# Patient Record
Sex: Male | Born: 1965 | Race: Black or African American | Hispanic: No | Marital: Single | Smoking: Former smoker
Health system: Southern US, Community
[De-identification: ages and names within clinical notes are randomized; demographics above are authoritative.]

## PROBLEM LIST (undated history)

## (undated) DIAGNOSIS — K219 Gastro-esophageal reflux disease without esophagitis: Secondary | ICD-10-CM

## (undated) HISTORY — PX: DENTAL SURGERY: SHX609

## (undated) HISTORY — PX: WISDOM TOOTH EXTRACTION: SHX21

## (undated) HISTORY — DX: Gastro-esophageal reflux disease without esophagitis: K21.9

---

## 1986-09-15 HISTORY — PX: ARM WOUND REPAIR / CLOSURE: SUR1141

## 1986-09-15 HISTORY — PX: FOREIGN BODY REMOVAL ABDOMINAL: SHX5319

## 2012-05-31 ENCOUNTER — Ambulatory Visit (INDEPENDENT_AMBULATORY_CARE_PROVIDER_SITE_OTHER): Payer: BC Managed Care – PPO | Admitting: Family Medicine

## 2012-05-31 VITALS — BP 132/74 | HR 50 | Temp 98.2°F | Resp 18 | Ht 72.0 in | Wt 222.0 lb

## 2012-05-31 DIAGNOSIS — R42 Dizziness and giddiness: Secondary | ICD-10-CM

## 2012-05-31 DIAGNOSIS — R079 Chest pain, unspecified: Secondary | ICD-10-CM

## 2012-05-31 DIAGNOSIS — K219 Gastro-esophageal reflux disease without esophagitis: Secondary | ICD-10-CM

## 2012-05-31 DIAGNOSIS — H8110 Benign paroxysmal vertigo, unspecified ear: Secondary | ICD-10-CM

## 2012-05-31 LAB — POCT CBC
Granulocyte percent: 46.6 %G (ref 37–80)
Lymph, poc: 2.4 (ref 0.6–3.4)
MCHC: 30.9 g/dL — AB (ref 31.8–35.4)
MID (cbc): 0.5 (ref 0–0.9)
MPV: 8.9 fL (ref 0–99.8)
POC Granulocyte: 2.5 (ref 2–6.9)
POC LYMPH PERCENT: 44.3 %L (ref 10–50)
POC MID %: 9.1 %M (ref 0–12)
Platelet Count, POC: 225 10*3/uL (ref 142–424)
RDW, POC: 14.6 %

## 2012-05-31 LAB — COMPREHENSIVE METABOLIC PANEL
AST: 21 U/L (ref 0–37)
Albumin: 5.3 g/dL — ABNORMAL HIGH (ref 3.5–5.2)
Alkaline Phosphatase: 49 U/L (ref 39–117)
Glucose, Bld: 75 mg/dL (ref 70–99)
Potassium: 4 mEq/L (ref 3.5–5.3)
Sodium: 140 mEq/L (ref 135–145)
Total Bilirubin: 1.8 mg/dL — ABNORMAL HIGH (ref 0.3–1.2)
Total Protein: 7.9 g/dL (ref 6.0–8.3)

## 2012-05-31 MED ORDER — OMEPRAZOLE 40 MG PO CPDR: 40.0000 mg | DELAYED_RELEASE_CAPSULE | Freq: Every day | ORAL | Status: DC

## 2012-05-31 NOTE — Patient Instructions (Addendum)
RETURN 7:45-2  ON Friday FOR RECHECK  TAKE ASPIRIN ONE DAILY FOR CIRCULATION  IF WORSE DIZZINESS WE WILL REFER YOU TO AN ENT DOCTOR FOR FURTHER EVALUATION  TAKE OMEPRAZOLE ONE DAILY FOR TO REDUCE STOMACH ACIDS

## 2012-05-31 NOTE — Progress Notes (Signed)
Subjective: Patient is a long-distance Naval architect. He has been having dizziness for a few days. When he stands up quickly or rolls over in bed he gets a roofing business. It does not last for long period he has had some nausea but no vomiting. He also has for some time been having some substernal chest pain. Does not smoke. He does do some exercise. He drinks barely weekends. He tries with the team back and forth to [place] every week. He says that this is positive, lying in a back of the truck sleeping and bouncing a long period  Objective: No acute distress. TMs normal. Eyes PERRLA. Fundi benign. EOMs intact. Throat clear. Neck supple without nodes thyromegaly. No carotid bruits. Chest clear. Heart regular without murmurs gallops or arrhythmias. Romberg negative. Finger to nose normal. Tandem walk normal. Cardiovascular normal. The motor strength is symmetrical.  Assessment: Positional vertigo Chest pains, atypical  Plan: EKG and labs  Results for orders placed in visit on 05/31/12  POCT CBC      Component Value Range   WBC 5.4  4.6 - 10.2 K/uL   Lymph, poc 2.4  0.6 - 3.4   POC LYMPH PERCENT 44.3  10 - 50 %L   MID (cbc) 0.5  0 - 0.9   POC MID % 9.1  0 - 12 %M   POC Granulocyte 2.5  2 - 6.9   Granulocyte percent 46.6  37 - 80 %G   RBC 5.42  4.69 - 6.13 M/uL   Hemoglobin 15.4  14.1 - 18.1 g/dL   HCT, POC 81.1  91.4 - 53.7 %   MCV 91.8  80 - 97 fL   MCH, POC 28.4  27 - 31.2 pg   MCHC 30.9 (*) 31.8 - 35.4 g/dL   RDW, POC 78.2     Platelet Count, POC 225  142 - 424 K/uL   MPV 8.9  0 - 99.8 fL  GLUCOSE, POCT (MANUAL RESULT ENTRY)      Component Value Range   POC Glucose 71  70 - 99 mg/dl   It sounds like he has a mild BPPV I am going to keep him out of driving for about 5 days, and see how he is doing by the end of the week and also going to treat his chest pains with omeprazole.

## 2012-05-31 NOTE — Progress Notes (Signed)
  Subjective:    [name], male    DOB: 06/11/1967, 46 y.o.   MRN: [id number]  HPI    Review of Systems     Objective:   Physical Exam        Assessment & Plan:

## 2012-06-04 ENCOUNTER — Ambulatory Visit (INDEPENDENT_AMBULATORY_CARE_PROVIDER_SITE_OTHER): Payer: BC Managed Care – PPO | Admitting: Family Medicine

## 2012-06-04 VITALS — BP 105/70 | HR 85 | Temp 98.2°F | Resp 16 | Ht 71.5 in | Wt 221.0 lb

## 2012-06-04 DIAGNOSIS — K219 Gastro-esophageal reflux disease without esophagitis: Secondary | ICD-10-CM

## 2012-06-04 DIAGNOSIS — H8110 Benign paroxysmal vertigo, unspecified ear: Secondary | ICD-10-CM
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
## 2012-06-04 NOTE — Patient Instructions (Signed)
Pull off of the road if any dizziness or chest pains, and get help.  Return to regular duty.  If recurrences of dizziness we will send to and ear-nose-throat doctor (otolaryngologist) for further testing.

## 2012-09-15 HISTORY — PX: COLONOSCOPY: SHX174

## 2012-09-15 HISTORY — PX: POLYPECTOMY: SHX149

## 2012-11-29 ENCOUNTER — Ambulatory Visit (INDEPENDENT_AMBULATORY_CARE_PROVIDER_SITE_OTHER): Payer: BC Managed Care – PPO | Admitting: Internal Medicine

## 2012-11-29 VITALS — BP 112/80 | HR 55 | Temp 98.3°F | Resp 14 | Ht 70.5 in | Wt 218.0 lb

## 2012-11-29 DIAGNOSIS — L739 Follicular disorder, unspecified: Secondary | ICD-10-CM

## 2012-11-29 DIAGNOSIS — L738 Other specified follicular disorders: Secondary | ICD-10-CM

## 2012-11-29 MED ORDER — DOXYCYCLINE HYCLATE 100 MG PO TABS: 100.0000 mg | ORAL_TABLET | Freq: Two times a day (BID) | ORAL | Status: DC

## 2012-11-29 NOTE — Patient Instructions (Addendum)
Folliculitis   Folliculitis is redness, soreness, and swelling (inflammation) of the hair follicles. This condition can occur anywhere on the body. People with weakened immune systems, diabetes, or obesity have a greater risk of getting folliculitis.  CAUSES   Bacterial infection. This is the most common cause.   Fungal infection.   Viral infection.   Contact with certain chemicals, especially oils and tars.  Long-term folliculitis can result from bacteria that live in the nostrils. The bacteria may trigger multiple outbreaks of folliculitis over time.  SYMPTOMS  Folliculitis most commonly occurs on the scalp, thighs, legs, back, buttocks, and areas where hair is shaved frequently. An early sign of folliculitis is a small, white or yellow, pus-filled, itchy lesion (pustule). These lesions appear on a red, inflamed follicle. They are usually less than 0.2 inches (5 mm) wide. When there is an infection of the follicle that goes deeper, it becomes a boil or furuncle. A group of closely packed boils creates a larger lesion (carbuncle). Carbuncles tend to occur in hairy, sweaty areas of the body.  DIAGNOSIS   Your caregiver can usually tell what is wrong by doing a physical exam. A sample may be taken from one of the lesions and tested in a lab. This can help determine what is causing your folliculitis.  TREATMENT   Treatment may include:   Applying warm compresses to the affected areas.   Taking antibiotic medicines orally or applying them to the skin.   Draining the lesions if they contain a large amount of pus or fluid.   Laser hair removal for cases of long-lasting folliculitis. This helps to prevent regrowth of the hair.  HOME CARE INSTRUCTIONS   Apply warm compresses to the affected areas as directed by your caregiver.   If antibiotics are prescribed, take them as directed. Finish them even if you start to feel better.   You may take over-the-counter medicines to relieve itching.   Do not shave  irritated skin.   Follow up with your caregiver as directed.  SEEK IMMEDIATE MEDICAL CARE IF:    You have increasing redness, swelling, or pain in the affected area.   You have a fever.  MAKE SURE YOU:   Understand these instructions.   Will watch your condition.   Will get help right away if you are not doing well or get worse.  Document Released: 11/10/2001 Document Revised: 03/02/2012 Document Reviewed: 12/02/2011  [employer] Patient Information 2013 [employer].

## 2012-11-29 NOTE — Progress Notes (Signed)
  Subjective:    [name], male    DOB: 06/11/1967, 47 y.o.   MRN: [id number]  HPI Has about 1 week of rash on arms and trunk. Itchy small papules assoc with hair follicles.   Review of Systems     Objective:   Physical Exam Left arm and left trunk total of about 5-10 papules. Very healthy and fit       Assessment & Plan:  Folliculitis/Doxycycline

## 2013-04-08 ENCOUNTER — Ambulatory Visit (INDEPENDENT_AMBULATORY_CARE_PROVIDER_SITE_OTHER): Payer: BC Managed Care – PPO | Admitting: Internal Medicine

## 2013-04-08 VITALS — BP 110/72 | HR 73 | Temp 98.0°F | Resp 18 | Ht 71.0 in | Wt 212.0 lb

## 2013-04-08 DIAGNOSIS — Z0000 Encounter for general adult medical examination without abnormal findings: Secondary | ICD-10-CM

## 2013-04-08 LAB — POCT CBC
Hemoglobin: 15.2 g/dL (ref 14.1–18.1)
MCH, POC: 29.9 pg (ref 27–31.2)
MCHC: 32.4 g/dL (ref 31.8–35.4)
MID (cbc): 0.4 (ref 0–0.9)
MPV: 9 fL (ref 0–99.8)
POC Granulocyte: 2.1 (ref 2–6.9)
POC MID %: 9.7 %M (ref 0–12)
Platelet Count, POC: 244 10*3/uL (ref 142–424)
RBC: 5.08 M/uL (ref 4.69–6.13)
WBC: 4.1 10*3/uL — AB (ref 4.6–10.2)

## 2013-04-08 LAB — COMPREHENSIVE METABOLIC PANEL
ALT: 24 U/L (ref 0–53)
CO2: 25 mEq/L (ref 19–32)
Creat: 1.19 mg/dL (ref 0.50–1.35)
Total Bilirubin: 1.4 mg/dL — ABNORMAL HIGH (ref 0.3–1.2)

## 2013-04-08 LAB — LIPID PANEL
Cholesterol: 170 mg/dL (ref 0–200)
HDL: 40 mg/dL (ref >39)
Total CHOL/HDL Ratio: 4.3 Ratio

## 2013-04-08 LAB — POCT URINALYSIS DIPSTICK
Blood, UA: NEGATIVE
Nitrite, UA: NEGATIVE
Protein, UA: 100
Spec Grav, UA: 1.025
Urobilinogen, UA: 1

## 2013-04-08 LAB — POCT UA - MICROSCOPIC ONLY
Crystals, Ur, HPF, POC: NEGATIVE
Sperm: POSITIVE
Yeast, UA: NEGATIVE
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
## 2013-04-08 NOTE — Patient Instructions (Signed)
Abdominal Aortic Aneurysm  An aneurysm is the enlargement (dilatation), bulging, or ballooning out of part of the wall of a vein or artery. An aortic aneurysm is a bulging in the largest artery of the body. This artery supplies blood from the heart to the rest of the body.  The first part of the aorta is called the thoracic aorta. It leaves the heart, rises (ascends), arches, and goes down (descends) through the chest until it reaches the diaphragm. The diaphragm is the muscular part between the chest and abdomen.  The second part of the aorta is called the abdominal aorta after it has passed the diaphragm and continues down through the abdomen. The abdominal aorta ends where it splits to form the two iliac arteries that go to the legs. Aortic aneurysms can develop anywhere along the length of the aorta. The majority are located along the abdominal aorta. The major concern with an aortic aneurysm is that it can enlarge and rupture. This can cause death unless diagnosed and treated promptly. Aneurysms can also develop blood clots or infections. CAUSES  Many aortic aneurysms are caused by arteriosclerosis. Arteriosclerosis can weaken the aortic wall. The pressure of the blood being pumped through the aorta causes it to balloon out at the site of weakness. Therefore, high blood pressure (hypertension) is associated with aneurysm. Other risk factors include:  Age over 60.  Tobacco use.  Being male.  White race.  Family history of aneurysm.  Less frequent causes of abdominal aortic aneurysms include:  Connective tissue diseases.  Abdominal trauma.  Inflammation of blood vessles (arteritis).  Inherited (congenital) malformations.  Infection. SYMPTOMS  The signs and symptoms of an unruptured aneurysm will partly depend on its size and rate of growth.   Abdominal aortic aneurysms may cause pain. The pain typically has a deep quality as if it is piercing into the person. It is felt most  often in the lower back area. The pain is usually steady but may be relieved by changing your body position.  The person may also become aware of an abnormally prominent pulse in the belly (abdominal pulsation). DIAGNOSIS  An aortic aneurysm may be discovered by chance on physical exam, or on X-ray studies done for other reasons. It may be suspected because of other problems such as back or abdominal pain. The following tests may help identify the problem.  X-rays of the abdomen can show calcium deposits in the aneurysm wall.  CT scanning of the abdomen, particularly with contrast medium, is accurate at showing the exact size and shape of the aneurysm.  Ultrasounds give a clear picture of the size of an aneurysm (about 98% accuracy).  MRI scanning is accurate, but often unnecessary.  An abdominal angiogram shows the source of the major blood vessels arising from the aorta. It reveals the size and extent of any aneurysm. It can also show a clot clinging to the wall of the aneurysm (mural thrombus). TREATMENT  Treating an abdominal aortic aneurysm depends on the size. A rupture of an aneurysm is uncommon when they are less than 5 cm wide (2 inches). Rupture is far more common in aneurysms that are over 6 cm wide (2.4 inches).  Surgical repair is usually recommended for all aneurysms over 6 cm wide (2.4 inches). This depends on the health, age, and other circumstances of the individual. This type of surgery consists of opening the abdomen, removing the aneurysm, and sewing a synthetic graft (similar to a cloth tube) in its place. A   less invasive form of this surgery, using stent grafts, is sometimes recommended.  For most patients, elective repair is recommended for aneurysms between 4 and 6 cm (1.6 and 2.4 inches). Elective means the surgery can be done at your convenience. This should not be put off too long if surgery is recommended.  If you smoke, stop immediately. Smoking is a major risk  factor for enlargement and rupture.  Medications may be used to help decrease complications  these include medicine to lower blood pressure and control cholesterol. HOME CARE INSTRUCTIONS   If you smoke, stop. Do not start smoking.  Take all medications as prescribed.  Your caregiver will tell you when to have your aneurysm rechecked, either by ultrasound or CT scan.  If your caregiver has given you a follow-up appointment, it is very important to keep that appointment. Not keeping the appointment could result in a chronic or permanent injury, pain, or disability. If there is any problem keeping the appointment, you must call back to this facility for assistance. SEEK MEDICAL CARE IF:   You develop mild abdominal pain or pressure.  You are able to feel or perceive your aneurysm, and you sense any change. SEEK IMMEDIATE MEDICAL CARE IF:   You develop severe abdominal pain, or severe pain moving (radiating) to your back.  You suddenly develop cold or blue toes or feet.  You suddenly develop lightheadedness or fainting spells. MAKE SURE YOU:   Understand these instructions.  Will watch your condition.  Will get help right away if you are not doing well or get worse. Document Released: 06/11/2005 Document Revised: 11/24/2011 Document Reviewed: 04/04/2008 [facility] Patient Information 2014 [place], [place]. Cerebral Aneurysm A cerebral aneurysm is the bulging or ballooning out of part of the wall of a vein or artery in the brain. CAUSES Common causes include:   Congenital (present since birth) defects.  High blood pressure.  The build-up of fatty deposits in the arteries (atherosclerosis).  Blood vessels that develop abnormally.  Diseases that cause weakening and damage to the walls of blood vessels. Uncommon causes include:  Head trauma (damage caused by an accident).  Infection.  Tumors.  Drug abuse (mostly from cocaine, heroin, and amphetamine use). Cerebral  aneurysms can occur at any age. They are more common in adults than in children. They and are slightly more common in women than in men.  SYMPTOMS  The signs and symptoms of an unruptured cerebral aneurysm will partly depend on its size and rate of growth.  A small, unchanging aneurysm will generally produce no symptoms. A larger aneurysm that is steadily growing may produce symptoms such as headache, neck stiffness or pain, loss of feeling in the face or problems with the eyes.  If an aneurysm bursts, the problem can be life-threatening. Symptoms may include:  A sudden and usually severe headache.  Neck stiffness or pain.  Confusion and/or drowsiness.  Problems speaking.  Weakness in an arm and/or a leg.  Nausea (feeling sick to your stomach).  Vision impairment.  Vomiting.  Loss of consciousness. Rupture of a cerebral aneurysm results in bleeding in the brain, causing a stroke. Or, blood can leak into the area around the brain and develop into a blood clot within the skull. More problems can occur as a result of the aneurysm breaking. These include:  Re-bleeding.  Hydrocephalus (an increase in normal brain fluid in the chambers inside the brain).  Vasospasm (blood vessels decrease in size and starve the brain of nutrients and oxygen).  TREATMENT  Emergency treatment for a ruptured cerebral aneurysm generally includes restoring breathing, and reducing pressure inside the head. Immediate emergency surgery may be recommended to help prevent damage caused by hydrocephalus and to reduce the risk of re-bleeding.  When aneurysms are discovered before rupture occurs, microcoil thrombosis or balloon embolization may be performed on patients for whom surgery is considered too risky. During these procedures, a thin, hollow tube (catheter) is inserted through an artery to travel up to the brain. Once the catheter reaches the aneurysm, tiny balloons or coils are used to block blood flow through  the aneurysm. Other treatments may include:  Bed rest.  Drug therapy.  Hypertensive-hypervolemic therapy (which elevates blood pressure, increases blood volume, and thins the blood) to drive blood flow through and around blocked arteries and control vasospasm. PROGNOSIS  The prognosis for a patient with a ruptured cerebral aneurysm depends on:  The extent and location of the aneurysm.  The person's age.  General health.  Neurological condition. Some people with a ruptured cerebral aneurysm die from the initial bleeding. Others recover with little or no problems. Early diagnosis and treatment are important. Document Released: 05/24/2002 Document Revised: 11/24/2011 Document Reviewed: 08/03/2007 [facility] Patient Information 2014 [place], [place].

## 2013-04-09 LAB — PSA: PSA: 0.45 ng/mL (ref <=4.00)

## 2013-04-09 LAB — TSH: TSH: 0.583 u[IU]/mL (ref 0.350–4.500)

## 2013-04-16 LAB — IFOBT (OCCULT BLOOD): IFOBT: NEGATIVE

## 2013-04-26 ENCOUNTER — Encounter: Payer: Self-pay | Admitting: Gastroenterology

## 2013-05-02 ENCOUNTER — Ambulatory Visit (AMBULATORY_SURGERY_CENTER): Payer: BC Managed Care – PPO | Admitting: *Deleted

## 2013-05-02 ENCOUNTER — Encounter: Payer: Self-pay | Admitting: Gastroenterology

## 2013-05-02 VITALS — Ht 71.0 in | Wt 221.4 lb

## 2013-05-02 DIAGNOSIS — Z1211 Encounter for screening for malignant neoplasm of colon: Secondary | ICD-10-CM

## 2013-05-02 MED ORDER — MOVIPREP 100 G PO SOLR: 1.0000 | Freq: Once | ORAL | Status: DC

## 2013-05-02 NOTE — Progress Notes (Signed)
No egg or soy allergy. No anesthesia problems.  

## 2013-05-18 ENCOUNTER — Telehealth: Payer: Self-pay | Admitting: Gastroenterology

## 2013-05-18 NOTE — Telephone Encounter (Signed)
no

## 2013-05-20 ENCOUNTER — Encounter: Payer: BC Managed Care – PPO | Admitting: Gastroenterology

## 2013-06-14 ENCOUNTER — Ambulatory Visit (AMBULATORY_SURGERY_CENTER): Payer: BC Managed Care – PPO | Admitting: Gastroenterology

## 2013-06-14 ENCOUNTER — Encounter: Payer: Self-pay | Admitting: Gastroenterology

## 2013-06-14 VITALS — BP 114/64 | HR 61 | Temp 98.1°F | Resp 16 | Ht 71.0 in | Wt 221.0 lb

## 2013-06-14 DIAGNOSIS — D126 Benign neoplasm of colon, unspecified: Secondary | ICD-10-CM

## 2013-06-14 DIAGNOSIS — Z1211 Encounter for screening for malignant neoplasm of colon: Secondary | ICD-10-CM

## 2013-06-14 MED ORDER — SODIUM CHLORIDE 0.9 % IV SOLN: 500.0000 mL | INTRAVENOUS | Status: DC
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
## 2013-06-14 NOTE — Op Note (Signed)
[facility] [address] [place], [address]   COLONOSCOPY PROCEDURE REPORT  PATIENT: Craig Patterson, Craig Patterson  MR#: [id number] BIRTHDATE: Feb 22, 1966 , 47  yrs. old GENDER: Male ENDOSCOPIST: Mardella Layman, MD, [facility] REFERRED XB:[id number] Alwyn Ren, M.D. PROCEDURE DATE:  06/14/2013 PROCEDURE:   Colonoscopy with biopsy and snare polypectomy First Screening Colonoscopy - Avg.  risk and is 50 yrs.  old or older Yes.  Prior Negative Screening - Now for repeat screening. N/A  History of Adenoma - Now for follow-up colonoscopy & has been > or = to 3 yrs.  N/A  Polyps Removed Today? Yes. ASA CLASS:   Class II INDICATIONS:average risk screening. MEDICATIONS: Propofol (Diprivan) 470 mg IV  DESCRIPTION OF PROCEDURE:   After the risks benefits and alternatives of the procedure were thoroughly explained, informed consent was obtained.  A digital rectal exam revealed no abnormalities of the rectum.   The LB [id number]  endoscope was introduced through the anus and advanced to the cecum, which was identified by both the appendix and ileocecal valve. No adverse events experienced.   The quality of the prep was good, using MoviPrep  The instrument was then slowly withdrawn as the colon was fully examined.      COLON FINDINGS: The colon was redundant.  Manual abdominal counter-pressure was used to reach the cecum.   A firm and smooth sessile polyp ranging between 5-39mm in size was found in the ascending colon.  A polypectomy was performed using snare cautery. The resection was complete and the polyp tissue was completely retrieved.   A small smooth sessile polyp was found in the ascending colon.  A biopsy was performed using cold forceps. Nodular IC valve visualized and ileum intubated.Tissue c/w lymphoid tissue.   Mild diverticulosis was noted in the descending colon and sigmoid colon.  Retroflexed views revealed no abnormalities. The time to cecum=4 minutes 44 seconds.   Withdrawal time=16 minutes 41 seconds.  The scope was withdrawn and the procedure completed. COMPLICATIONS: There were no complications.  ENDOSCOPIC IMPRESSION: 1.   The colon was redundant 2.   Sessile polyp ranging between 5-5mm in size was found in the ascending colon; polypectomy was performed using snare cautery 3.   Small sessile polyp was found in the ascending colon; biopsy was performed using cold forceps 4.   Nodular IC valve visualized and ileum intubated.Tissue c/w lymphoid tissue. 5.   Mild diverticulosis was noted in the descending colon and sigmoid colon  RECOMMENDATIONS: 1.  Await pathology results 2.  Continue current medications 3.  Repeat Colonoscopy in 3 years.   eSigned:  Mardella Layman, MD, [facility] 06/14/2013 2:07 PM   cc:   PATIENT NAME:  [name], [name] MR#: [id number]

## 2013-06-14 NOTE — Progress Notes (Signed)
Report to pacu rn, vss, bbs=clear 

## 2013-06-14 NOTE — Progress Notes (Signed)
Called to room to assist during endoscopic procedure.  Patient ID and intended procedure confirmed with present staff. Received instructions for my participation in the procedure from the performing physician.  

## 2013-06-14 NOTE — Patient Instructions (Addendum)
Discharge instructions given with verbal understanding. Handouts on polyps and diverticulosis. Resume previous medications. YOU HAD AN ENDOSCOPIC PROCEDURE TODAY AT THE [facility]: Refer to the procedure report that was given to you for any specific questions about what was found during the examination.  If the procedure report does not answer your questions, please call your gastroenterologist to clarify.  If you requested that your care partner not be given the details of your procedure findings, then the procedure report has been included in a sealed envelope for you to review at your convenience later.  YOU SHOULD EXPECT: Some feelings of bloating in the abdomen. Passage of more gas than usual.  Walking can help get rid of the air that was put into your GI tract during the procedure and reduce the bloating. If you had a lower endoscopy (such as a colonoscopy or flexible sigmoidoscopy) you may notice spotting of blood in your stool or on the toilet paper. If you underwent a bowel prep for your procedure, then you may not have a normal bowel movement for a few days.  DIET: Your first meal following the procedure should be a light meal and then it is ok to progress to your normal diet.  A half-sandwich or bowl of soup is an example of a good first meal.  Heavy or fried foods are harder to digest and may make you feel nauseous or bloated.  Likewise meals heavy in dairy and vegetables can cause extra gas to form and this can also increase the bloating.  Drink plenty of fluids but you should avoid alcoholic beverages for 24 hours.  ACTIVITY: Your care partner should take you home directly after the procedure.  You should plan to take it easy, moving slowly for the rest of the day.  You can resume normal activity the day after the procedure however you should NOT DRIVE or use heavy machinery for 24 hours (because of the sedation medicines used during the test).    SYMPTOMS TO REPORT  IMMEDIATELY: A gastroenterologist can be reached at any hour.  During normal business hours, 8:30 AM to 5:00 PM Monday through Friday, call [phone].  After hours and on weekends, please call the GI answering service at [phone] who will take a message and have the physician on call contact you.   Following lower endoscopy (colonoscopy or flexible sigmoidoscopy):  Excessive amounts of blood in the stool  Significant tenderness or worsening of abdominal pains  Swelling of the abdomen that is new, acute  Fever of 100F or higher  FOLLOW UP: If any biopsies were taken you will be contacted by phone or by letter within the next 1-3 weeks.  Call your gastroenterologist if you have not heard about the biopsies in 3 weeks.  Our staff will call the home number listed on your records the next business day following your procedure to check on you and address any questions or concerns that you may have at that time regarding the information given to you following your procedure. This is a courtesy call and so if there is no answer at the home number and we have not heard from you through the emergency physician on call, we will assume that you have returned to your regular daily activities without incident.  SIGNATURES/CONFIDENTIALITY: You and/or your care partner have signed paperwork which will be entered into your electronic medical record.  These signatures attest to the fact that that the information above on your After Visit Summary   has been reviewed and is understood.  Full responsibility of the confidentiality of this discharge information lies with you and/or your care-partner. 

## 2013-06-15 ENCOUNTER — Telehealth: Payer: Self-pay | Admitting: *Deleted

## 2013-06-15 NOTE — Telephone Encounter (Signed)
  Follow up Call-  Call back number 06/14/2013  Post procedure Call Back phone  # [phone]  Permission to leave phone message Yes     Patient questions:  Do you have a fever, pain , or abdominal swelling? no Pain Score  0 *  Have you tolerated food without any problems? yes  Have you been able to return to your normal activities? yes  Do you have any questions about your discharge instructions: Diet   no Medications  no Follow up visit  no  Do you have questions or concerns about your Care? no  Actions: * If pain score is 4 or above: No action needed, pain <4.

## 2013-06-20 ENCOUNTER — Encounter: Payer: Self-pay | Admitting: Gastroenterology

## 2013-10-19 ENCOUNTER — Ambulatory Visit (INDEPENDENT_AMBULATORY_CARE_PROVIDER_SITE_OTHER): Payer: BC Managed Care – PPO | Admitting: Emergency Medicine

## 2013-10-19 ENCOUNTER — Ambulatory Visit: Payer: BC Managed Care – PPO

## 2013-10-19 VITALS — BP 110/68 | HR 61 | Temp 98.5°F | Resp 16 | Ht 71.0 in | Wt 218.0 lb

## 2013-10-19 DIAGNOSIS — R109 Unspecified abdominal pain: Secondary | ICD-10-CM

## 2013-10-19 LAB — POCT UA - MICROSCOPIC ONLY
Bacteria, U Microscopic: NEGATIVE
CASTS, UR, LPF, POC: NEGATIVE
Crystals, Ur, HPF, POC: NEGATIVE
Epithelial cells, urine per micros: NEGATIVE
Mucus, UA: NEGATIVE
RBC, urine, microscopic: NEGATIVE
Yeast, UA: NEGATIVE

## 2013-10-19 LAB — POCT URINALYSIS DIPSTICK
Bilirubin, UA: NEGATIVE
Blood, UA: NEGATIVE
GLUCOSE UA: NEGATIVE
Ketones, UA: NEGATIVE
Leukocytes, UA: NEGATIVE
NITRITE UA: NEGATIVE
Protein, UA: 30
Spec Grav, UA: 1.025
UROBILINOGEN UA: 0.2
pH, UA: 5.5

## 2013-10-19 LAB — POCT CBC
Granulocyte percent: 51.2 %G (ref 37–80)
HCT, POC: 47.4 % (ref 43.5–53.7)
Hemoglobin: 15 g/dL (ref 14.1–18.1)
Lymph, poc: 1.9 (ref 0.6–3.4)
MCH: 29.5 pg (ref 27–31.2)
MCHC: 31.6 g/dL — AB (ref 31.8–35.4)
MCV: 93.3 fL (ref 80–97)
MID (CBC): 0.4 (ref 0–0.9)
MPV: 8.9 fL (ref 0–99.8)
PLATELET COUNT, POC: 218 10*3/uL (ref 142–424)
POC Granulocyte: 2.4 (ref 2–6.9)
POC LYMPH PERCENT: 40.6 %L (ref 10–50)
POC MID %: 8.2 % (ref 0–12)
RBC: 5.08 M/uL (ref 4.69–6.13)
RDW, POC: 14.3 %
WBC: 4.7 10*3/uL (ref 4.6–10.2)

## 2013-10-19 LAB — IFOBT (OCCULT BLOOD): IFOBT: NEGATIVE

## 2013-10-19 MED ORDER — DOXYCYCLINE HYCLATE 100 MG PO CAPS: 100.0000 mg | ORAL_CAPSULE | Freq: Two times a day (BID) | ORAL | Status: DC

## 2013-10-19 MED ORDER — MELOXICAM 7.5 MG PO TABS: ORAL_TABLET | ORAL | Status: DC
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
## 2013-10-19 NOTE — Patient Instructions (Signed)
Appointment for CT abdomen and pelvis at [facility] tomorrow morning . Report to [facility] at 8:30. GO BY THIS AFTERNOON TO PICK UP CONTRAST!

## 2013-10-20 ENCOUNTER — Other Ambulatory Visit: Payer: BC Managed Care – PPO

## 2015-06-09 ENCOUNTER — Ambulatory Visit (INDEPENDENT_AMBULATORY_CARE_PROVIDER_SITE_OTHER): Payer: BLUE CROSS/BLUE SHIELD | Admitting: Emergency Medicine

## 2015-06-09 VITALS — BP 110/72 | HR 82 | Temp 98.9°F | Resp 16 | Ht 71.0 in | Wt 214.4 lb

## 2015-06-09 DIAGNOSIS — J0140 Acute pansinusitis, unspecified: Secondary | ICD-10-CM | POA: Diagnosis not present

## 2015-06-09 DIAGNOSIS — J209 Acute bronchitis, unspecified: Secondary | ICD-10-CM | POA: Diagnosis not present

## 2015-06-09 MED ORDER — AMOXICILLIN-POT CLAVULANATE 875-125 MG PO TABS: 1.0000 | ORAL_TABLET | Freq: Two times a day (BID) | ORAL | Status: DC

## 2015-06-09 MED ORDER — PSEUDOEPHEDRINE-GUAIFENESIN ER 60-600 MG PO TB12: 1.0000 | ORAL_TABLET | Freq: Two times a day (BID) | ORAL | Status: DC

## 2015-06-09 MED ORDER — HYDROCOD POLST-CPM POLST ER 10-8 MG/5ML PO SUER: 5.0000 mL | Freq: Two times a day (BID) | ORAL | Status: DC

## 2015-06-09 NOTE — Patient Instructions (Signed)
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
## 2015-08-24 ENCOUNTER — Ambulatory Visit (INDEPENDENT_AMBULATORY_CARE_PROVIDER_SITE_OTHER): Payer: BLUE CROSS/BLUE SHIELD | Admitting: Family Medicine

## 2015-08-24 ENCOUNTER — Emergency Department (HOSPITAL_COMMUNITY)
Admission: EM | Admit: 2015-08-24 | Discharge: 2015-08-24 | Disposition: A | Discharge status: Home / Self Care | Payer: BLUE CROSS/BLUE SHIELD | Attending: Emergency Medicine | Admitting: Emergency Medicine

## 2015-08-24 ENCOUNTER — Encounter (HOSPITAL_COMMUNITY): Payer: Self-pay | Admitting: *Deleted

## 2015-08-24 ENCOUNTER — Ambulatory Visit (INDEPENDENT_AMBULATORY_CARE_PROVIDER_SITE_OTHER): Payer: BLUE CROSS/BLUE SHIELD

## 2015-08-24 VITALS — BP 110/66 | HR 86 | Temp 98.4°F | Resp 16 | Ht 71.0 in | Wt 218.4 lb

## 2015-08-24 DIAGNOSIS — K5900 Constipation, unspecified: Secondary | ICD-10-CM | POA: Insufficient documentation

## 2015-08-24 DIAGNOSIS — R34 Anuria and oliguria: Secondary | ICD-10-CM | POA: Insufficient documentation

## 2015-08-24 DIAGNOSIS — R1084 Generalized abdominal pain: Secondary | ICD-10-CM | POA: Diagnosis not present

## 2015-08-24 DIAGNOSIS — R39198 Other difficulties with micturition: Secondary | ICD-10-CM

## 2015-08-24 DIAGNOSIS — Z79899 Other long term (current) drug therapy: Secondary | ICD-10-CM | POA: Insufficient documentation

## 2015-08-24 DIAGNOSIS — M549 Dorsalgia, unspecified: Secondary | ICD-10-CM | POA: Diagnosis not present

## 2015-08-24 DIAGNOSIS — Z87891 Personal history of nicotine dependence: Secondary | ICD-10-CM | POA: Insufficient documentation

## 2015-08-24 DIAGNOSIS — Z87828 Personal history of other (healed) physical injury and trauma: Secondary | ICD-10-CM | POA: Insufficient documentation

## 2015-08-24 DIAGNOSIS — R1030 Lower abdominal pain, unspecified: Secondary | ICD-10-CM | POA: Diagnosis present

## 2015-08-24 DIAGNOSIS — K429 Umbilical hernia without obstruction or gangrene: Secondary | ICD-10-CM

## 2015-08-24 DIAGNOSIS — R300 Dysuria: Secondary | ICD-10-CM | POA: Diagnosis not present

## 2015-08-24 DIAGNOSIS — K5641 Fecal impaction: Secondary | ICD-10-CM

## 2015-08-24 LAB — I-STAT CHEM 8, ED
BUN: 16 mg/dL (ref 6–20)
CHLORIDE: 104 mmol/L (ref 101–111)
CREATININE: 1 mg/dL (ref 0.61–1.24)
Calcium, Ion: 1.11 mmol/L — ABNORMAL LOW (ref 1.12–1.23)
Glucose, Bld: 98 mg/dL (ref 65–99)
HEMATOCRIT: 48 % (ref 39.0–52.0)
HEMOGLOBIN: 16.3 g/dL (ref 13.0–17.0)
POTASSIUM: 4.6 mmol/L (ref 3.5–5.1)
SODIUM: 138 mmol/L (ref 135–145)
TCO2: 23 mmol/L (ref 0–100)

## 2015-08-24 LAB — URINALYSIS, ROUTINE W REFLEX MICROSCOPIC
BILIRUBIN URINE: NEGATIVE
Glucose, UA: NEGATIVE mg/dL
HGB URINE DIPSTICK: NEGATIVE
Ketones, ur: 40 mg/dL — AB
Leukocytes, UA: NEGATIVE
Nitrite: NEGATIVE
PROTEIN: NEGATIVE mg/dL
SPECIFIC GRAVITY, URINE: 1.024 (ref 1.005–1.030)
pH: 7 (ref 5.0–8.0)

## 2015-08-24 MED ORDER — POLYETHYLENE GLYCOL 3350 17 G PO PACK: 17.0000 g | PACK | Freq: Every day | ORAL | Status: DC

## 2015-08-24 MED ORDER — DOCUSATE SODIUM 100 MG PO CAPS: 100.0000 mg | ORAL_CAPSULE | Freq: Two times a day (BID) | ORAL | Status: DC

## 2015-08-24 MED ORDER — SORBITOL 70 % SOLN
960.0000 mL | TOPICAL_OIL | Freq: Once | ORAL | Status: AC
  Administered 2015-08-24: 960 mL via RECTAL
  Filled 2015-08-24: qty 240

## 2015-08-24 NOTE — ED Notes (Signed)
Pt denies vomiting but stated "I have had some nausea a few times this week."
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
## 2015-08-24 NOTE — Patient Instructions (Addendum)
You do have a large amount of stool that may be causing the difficulty urinating, but also some increased air in the intestines. Go to [facility] emergency room as soon as you leave here for further treatment including a possible catheter to relieve the urine pressure, and to determine if you may need a CT scan or other evaluation for the constipation.  I did place a referral for general surgery to evaluate you for an umbilical hernia. Their office will call you. See precautions for this below.  Fecal Impaction A fecal impaction happens when there is a large, firm amount of stool (or feces) that cannot be passed. The impacted stool is usually in the rectum, which is the lowest part of the large bowel. The impacted stool can block the colon and cause significant problems. CAUSES  The longer stool stays in the rectum, the harder it gets. Anything that slows down your bowel movements can lead to fecal impaction, such as:  Constipation. This can be a long-standing (chronic) problem or can happen suddenly (acute).  Painful conditions of the rectum, such as hemorrhoids or anal fissures. The pain of these conditions can make you try to avoid having bowel movements.  Narcotic pain-relieving medicines, such as methadone, morphine, or codeine.  Not drinking enough fluids.  Inactivity and bed rest over long periods of time.  Diseases of the brain or nervous system that damage the nerves controlling the muscles of the intestines. SIGNS AND SYMPTOMS   Lack of normal bowel movements or changes in bowel patterns.  Sense of fullness in the rectum but unable to pass stool.  Pain or cramps in the abdominal area (often after meals).  Thin, watery discharge from the rectum. DIAGNOSIS  Your health care provider may suspect that you have a fecal impaction based on your symptoms and a physical exam. This will include an exam of your rectum. Sometimes X-rays or lab testing may be needed to confirm the  diagnosis and to be sure there are no other problems.  TREATMENT   Initially an impaction can be removed manually. Using a gloved finger, your health care provider can remove hard stool from your rectum.  Medicine is sometimes needed. A suppository or enema can be given in the rectum to soften the stool, which can stimulate a bowel movement. Medicines can also be given by mouth (orally).  Though rare, surgery may be needed if the colon has torn (perforated) due to blockage. HOME CARE INSTRUCTIONS   Develop regular bowel habits. This could include getting in the habit of having a bowel movement after your morning cup of coffee or after eating. Be sure to allow yourself enough time on the toilet.  Maintain a high-fiber diet.  Drink enough fluids to keep your urine clear or pale yellow as directed by your health care provider.  Exercise regularly.  If you begin to get constipated, increase the amount of fiber in your diet. Eat plenty of fruits, vegetables, whole wheat breads, bran, oatmeal, and similar products.  Take natural fiber laxatives or other laxatives only as directed by your health care provider. SEEK MEDICAL CARE IF:   You have ongoing rectal pain.  You require enemas or suppositories more than twice a week.  You have rectal bleeding.  You have continued problems, or you develop abdominal pain.  You have thin, pencil-like stools. SEEK IMMEDIATE MEDICAL CARE IF:  You have black or tarry stools. MAKE SURE YOU:   Understand these instructions.  Will watch your condition.  Will get help right away if you are not doing well or get worse.   This information is not intended to replace advice given to you by your health care provider. Make sure you discuss any questions you have with your health care provider.   Document Released: 05/24/2004 Document Revised: 06/22/2013 Document Reviewed: 03/08/2013 Elsevier Interactive Patient Education 2016 [place],  Adult A hernia is the bulging of an organ or tissue through a weak spot in the muscles of the abdomen (abdominal wall). Hernias develop most often near the navel or groin. There are many kinds of hernias. Common kinds include:  Femoral hernia. This kind of hernia develops under the groin in the upper thigh area.  Inguinal hernia. This kind of hernia develops in the groin or scrotum.  Umbilical hernia. This kind of hernia develops near the navel.  Hiatal hernia. This kind of hernia causes part of the stomach to be pushed up into the chest.  Incisional hernia. This kind of hernia bulges through a scar from an abdominal surgery. CAUSES This condition may be caused by:  Heavy lifting.  Coughing over a long period of time.  Straining to have a bowel movement.  An incision made during an abdominal surgery.  A birth defect (congenital defect).  Excess weight or obesity.  Smoking.  Poor nutrition.  Cystic fibrosis.  Excess fluid in the abdomen.  Undescended testicles. SYMPTOMS Symptoms of a hernia include:  A lump on the abdomen. This is the first sign of a hernia. The lump may become more obvious with standing, straining, or coughing. It may get bigger over time if it is not treated or if the condition causing it is not treated.  Pain. A hernia is usually painless, but it may become painful over time if treatment is delayed. The pain is usually dull and may get worse with standing or lifting heavy objects. Sometimes a hernia gets tightly squeezed in the weak spot (strangulated) or stuck there (incarcerated) and causes additional symptoms. These symptoms may include:  Vomiting.  Nausea.  Constipation.  Irritability. DIAGNOSIS A hernia may be diagnosed with:  A physical exam. During the exam your health care provider may ask you to cough or to make a specific movement, because a hernia is usually more visible when you move.  Imaging tests. These can  include:  X-rays.  Ultrasound.  CT scan. TREATMENT A hernia that is small and painless may not need to be treated. A hernia that is large or painful may be treated with surgery. Inguinal hernias may be treated with surgery to prevent incarceration or strangulation. Strangulated hernias are always treated with surgery, because lack of blood to the trapped organ or tissue can cause it to die. Surgery to treat a hernia involves pushing the bulge back into place and repairing the weak part of the abdomen. HOME CARE INSTRUCTIONS  Avoid straining.  Do not lift anything heavier than 10 lb (4.5 kg).  Lift with your leg muscles, not your back muscles. This helps avoid strain.  When coughing, try to cough gently.  Prevent constipation. Constipation leads to straining with bowel movements, which can make a hernia worse or cause a hernia repair to break down. You can prevent constipation by:  Eating a high-fiber diet that includes plenty of fruits and vegetables.  Drinking enough fluids to keep your urine clear or pale yellow. Aim to drink 6-8 glasses of water per day.  Using a stool softener as directed by your  health care provider.  Lose weight, if you are overweight.  Do not use any tobacco products, including cigarettes, chewing tobacco, or electronic cigarettes. If you need help quitting, ask your health care provider.  Keep all follow-up visits as directed by your health care provider. This is important. Your health care provider may need to monitor your condition. SEEK MEDICAL CARE IF:  You have swelling, redness, and pain in the affected area.  Your bowel habits change. SEEK IMMEDIATE MEDICAL CARE IF:  You have a fever.  You have abdominal pain that is getting worse.  You feel nauseous or you vomit.  You cannot push the hernia back in place by gently pressing on it while you are lying down.  The hernia:  Changes in shape or size.  Is stuck outside the  abdomen.  Becomes discolored.  Feels hard or tender.   This information is not intended to replace advice given to you by your health care provider. Make sure you discuss any questions you have with your health care provider.   Document Released: 09/01/2005 Document Revised: 09/22/2014 Document Reviewed: 07/12/2014 Elsevier Interactive Patient Education [employer].

## 2015-08-24 NOTE — ED Notes (Signed)
Bed: [id number] Expected date:  Expected time:  Means of arrival:  Comments: Ileus, urinary retention from [place]

## 2015-08-24 NOTE — ED Notes (Signed)
Pt came from UC d/t not voiding since 0100 08/24/15.  Last BM was yesterday but was a small amount.
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
## 2015-08-27 LAB — GC/CHLAMYDIA PROBE AMP (~~LOC~~) NOT AT ARMC
CHLAMYDIA, DNA PROBE: NEGATIVE
Neisseria Gonorrhea: NEGATIVE

## 2015-10-02 ENCOUNTER — Other Ambulatory Visit: Payer: Self-pay | Admitting: Surgery

## 2016-04-18 ENCOUNTER — Encounter: Payer: Self-pay | Admitting: Internal Medicine

## 2016-08-28 ENCOUNTER — Ambulatory Visit (INDEPENDENT_AMBULATORY_CARE_PROVIDER_SITE_OTHER): Payer: BLUE CROSS/BLUE SHIELD | Admitting: Physician Assistant

## 2016-08-28 VITALS — BP 124/72 | HR 78 | Temp 98.6°F | Ht 71.0 in | Wt 210.6 lb

## 2016-08-28 DIAGNOSIS — J209 Acute bronchitis, unspecified: Secondary | ICD-10-CM

## 2016-08-28 MED ORDER — DOXYCYCLINE HYCLATE 100 MG PO TABS: 100.0000 mg | ORAL_TABLET | Freq: Two times a day (BID) | ORAL | 0 refills | Status: DC

## 2016-08-28 MED ORDER — BENZONATATE 100 MG PO CAPS: ORAL_CAPSULE | ORAL | 0 refills | Status: AC

## 2016-08-28 MED ORDER — GUAIFENESIN ER 1200 MG PO TB12: 1.0000 | ORAL_TABLET | Freq: Two times a day (BID) | ORAL | 0 refills | Status: AC

## 2016-08-28 NOTE — Patient Instructions (Addendum)
  Nasal saline to help with mucus membrane   IF you received an x-ray today, you will receive an invoice from [facility]. Please contact [facility] at [phone] with questions or concerns regarding your invoice.   IF you received labwork today, you will receive an invoice from [employer]. Please contact [facility] at [phone] with questions or concerns regarding your invoice.   Our billing staff will not be able to assist you with questions regarding bills from these companies.  You will be contacted with the lab results as soon as they are available. The fastest way to get your results is to activate your My Chart account. Instructions are located on the last page of this paperwork. If you have not heard from [place] regarding the results in 2 weeks, please contact this office.

## 2016-08-28 NOTE — Progress Notes (Signed)
   Craig Patterson  MRN: [id number] DOB: Dec 22, 1965  Subjective:  Pt presents to clinic with cough for the last 2 weeks - he started with cold symptom of nasal congestion and sore throat with cough and he has not gotten better.  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  Review of Systems  Constitutional: Positive for chills. Negative for fever.  HENT: Positive for congestion and rhinorrhea (green with some blood). Negative for sore throat.   Respiratory: Positive for cough (green). Negative for shortness of breath and wheezing.        No h/o asthma, nonsmoker  Gastrointestinal: Negative.   Musculoskeletal: Negative for myalgias.  Neurological: Negative for headaches.    There are no active problems to display for this patient.   No current outpatient prescriptions on file prior to visit.   No current facility-administered medications on file prior to visit.     No Known Allergies  Pt patients past, family and social history were reviewed and updated.   Objective:  BP 124/72 (BP Location: Right Arm, Patient Position: Sitting, Cuff Size: Large)   Pulse 78   Temp 98.6 F (37 C) (Oral)   Ht 5\' 11"  (1.803 m)   Wt 210 lb 9.6 oz (95.5 kg)   SpO2 96%   BMI 29.37 kg/m   Physical Exam  Constitutional: He is oriented to person, place, and time and well-developed, well-nourished, and in no distress.  HENT:  Head: Normocephalic and atraumatic.  Right Ear: Hearing, tympanic membrane, external ear and ear canal normal.  Left Ear: Hearing, tympanic membrane, external ear and ear canal normal.  Nose: Nose normal.  Mouth/Throat: Uvula is midline, oropharynx is clear and moist and mucous membranes are normal.  Eyes: Conjunctivae are normal.  Neck: Normal range of motion.  Cardiovascular: Normal rate, regular rhythm and normal heart sounds.   Pulmonary/Chest: Effort normal and breath sounds  normal. He has no wheezes.  Lymphadenopathy:       Head (right side): No tonsillar adenopathy present.       Head (left side): No tonsillar adenopathy present.    He has no cervical adenopathy.       Right: No supraclavicular adenopathy present.       Left: No supraclavicular adenopathy present.  Neurological: He is alert and oriented to person, place, and time. Gait normal.  Skin: Skin is warm and dry.  Psychiatric: Mood, memory, affect and judgment normal.    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  Windell Hummingbird PA-C  [facility] 08/28/2016 11:08 AM

## 2017-09-14 ENCOUNTER — Ambulatory Visit (INDEPENDENT_AMBULATORY_CARE_PROVIDER_SITE_OTHER): Payer: BLUE CROSS/BLUE SHIELD | Admitting: Urgent Care

## 2017-09-14 ENCOUNTER — Encounter: Payer: Self-pay | Admitting: Urgent Care

## 2017-09-14 VITALS — BP 102/70 | HR 84 | Temp 99.0°F | Resp 16 | Ht 71.0 in | Wt 213.8 lb

## 2017-09-14 DIAGNOSIS — J9801 Acute bronchospasm: Secondary | ICD-10-CM

## 2017-09-14 DIAGNOSIS — B9789 Other viral agents as the cause of diseases classified elsewhere: Secondary | ICD-10-CM

## 2017-09-14 DIAGNOSIS — J069 Acute upper respiratory infection, unspecified: Secondary | ICD-10-CM

## 2017-09-14 DIAGNOSIS — R110 Nausea: Secondary | ICD-10-CM

## 2017-09-14 MED ORDER — PSEUDOEPHEDRINE HCL ER 120 MG PO TB12: 120.0000 mg | ORAL_TABLET | Freq: Two times a day (BID) | ORAL | 3 refills | Status: DC

## 2017-09-14 MED ORDER — HYDROCODONE-HOMATROPINE 5-1.5 MG/5ML PO SYRP: 5.0000 mL | ORAL_SOLUTION | Freq: Every evening | ORAL | 0 refills | Status: DC | PRN

## 2017-09-14 MED ORDER — ONDANSETRON 8 MG PO TBDP: 8.0000 mg | ORAL_TABLET | Freq: Three times a day (TID) | ORAL | 0 refills | Status: DC | PRN

## 2017-09-14 MED ORDER — BENZONATATE 100 MG PO CAPS: 100.0000 mg | ORAL_CAPSULE | Freq: Three times a day (TID) | ORAL | 0 refills | Status: DC | PRN

## 2017-09-14 MED ORDER — ALBUTEROL SULFATE HFA 108 (90 BASE) MCG/ACT IN AERS: 2.0000 | INHALATION_SPRAY | Freq: Four times a day (QID) | RESPIRATORY_TRACT | 1 refills | Status: DC | PRN
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
## 2017-09-14 NOTE — Patient Instructions (Addendum)
For sore throat try using a honey-based tea. Use 3 teaspoons of honey with juice squeezed from half lemon. Place shaved pieces of ginger into 1/2-1 cup of water and warm over stove top. Then mix the ingredients and repeat every 4 hours as needed.     Upper Respiratory Infection, Adult Most upper respiratory infections (URIs) are caused by a virus. A URI affects the nose, throat, and upper air passages. The most common type of URI is often called "the common cold." Follow these instructions at home:  Take medicines only as told by your doctor.  Gargle warm saltwater or take cough drops to comfort your throat as told by your doctor.  Use a warm mist humidifier or inhale steam from a shower to increase air moisture. This may make it easier to breathe.  Drink enough fluid to keep your pee (urine) clear or pale yellow.  Eat soups and other clear broths.  Have a healthy diet.  Rest as needed.  Go back to work when your fever is gone or your doctor says it is okay. ? You may need to stay home longer to avoid giving your URI to others. ? You can also wear a face mask and wash your hands often to prevent spread of the virus.  Use your inhaler more if you have asthma.  Do not use any tobacco products, including cigarettes, chewing tobacco, or electronic cigarettes. If you need help quitting, ask your doctor. Contact a doctor if:  You are getting worse, not better.  Your symptoms are not helped by medicine.  You have chills.  You are getting more short of breath.  You have brown or red mucus.  You have yellow or brown discharge from your nose.  You have pain in your face, especially when you bend forward.  You have a fever.  You have puffy (swollen) neck glands.  You have pain while swallowing.  You have white areas in the back of your throat. Get help right away if:  You have very bad or constant: ? Headache. ? Ear pain. ? Pain in your forehead, behind your eyes, and  over your cheekbones (sinus pain). ? Chest pain.  You have long-lasting (chronic) lung disease and any of the following: ? Wheezing. ? Long-lasting cough. ? Coughing up blood. ? A change in your usual mucus.  You have a stiff neck.  You have changes in your: ? Vision. ? Hearing. ? Thinking. ? Mood. This information is not intended to replace advice given to you by your health care provider. Make sure you discuss any questions you have with your health care provider. Document Released: 02/18/2008 Document Revised: 05/04/2016 Document Reviewed: 12/07/2013 Elsevier Interactive Patient Education  2018 [place].     Cough, Adult A cough helps to clear your throat and lungs. A cough may last only 2-3 weeks (acute), or it may last longer than 8 weeks (chronic). Many different things can cause a cough. A cough may be a sign of an illness or another medical condition. Follow these instructions at home:  Pay attention to any changes in your cough.  Take medicines only as told by your doctor. ? If you were prescribed an antibiotic medicine, take it as told by your doctor. Do not stop taking it even if you start to feel better. ? Talk with your doctor before you try using a cough medicine.  Drink enough fluid to keep your pee (urine) clear or pale yellow.  If the air is  dry, use a cold steam vaporizer or humidifier in your home.  Stay away from things that make you cough at work or at home.  If your cough is worse at night, try using extra pillows to raise your head up higher while you sleep.  Do not smoke, and try not to be around smoke. If you need help quitting, ask your doctor.  Do not have caffeine.  Do not drink alcohol.  Rest as needed. Contact a doctor if:  You have new problems (symptoms).  You cough up yellow fluid (pus).  Your cough does not get better after 2-3 weeks, or your cough gets worse.  Medicine does not help your cough and you are not sleeping  well.  You have pain that gets worse or pain that is not helped with medicine.  You have a fever.  You are losing weight and you do not know why.  You have night sweats. Get help right away if:  You cough up blood.  You have trouble breathing.  Your heartbeat is very fast. This information is not intended to replace advice given to you by your health care provider. Make sure you discuss any questions you have with your health care provider. Document Released: 05/15/2011 Document Revised: 02/07/2016 Document Reviewed: 11/08/2014 Elsevier Interactive Patient Education  2018 [employer].     IF you received an x-ray today, you will receive an invoice from [facility]. Please contact [facility] at [phone] with questions or concerns regarding your invoice.   IF you received labwork today, you will receive an invoice from [place]. Please contact [facility] at [phone] with questions or concerns regarding your invoice.   Our billing staff will not be able to assist you with questions regarding bills from these companies.  You will be contacted with the lab results as soon as they are available. The fastest way to get your results is to activate your My Chart account. Instructions are located on the last page of this paperwork. If you have not heard from [place] regarding the results in 2 weeks, please contact this office.

## 2017-09-23 ENCOUNTER — Telehealth: Payer: Self-pay | Admitting: Urgent Care

## 2017-09-23 NOTE — Telephone Encounter (Signed)
Called to remind pt about their appt tomorrow 09/24/17 °

## 2017-09-24 ENCOUNTER — Ambulatory Visit: Payer: BLUE CROSS/BLUE SHIELD | Admitting: Urgent Care

## 2017-10-30 DIAGNOSIS — H1132 Conjunctival hemorrhage, left eye: Secondary | ICD-10-CM | POA: Diagnosis not present

## 2018-12-27 ENCOUNTER — Ambulatory Visit
Admission: EM | Admit: 2018-12-27 | Discharge: 2018-12-27 | Disposition: A | Discharge status: Home / Self Care | Payer: BLUE CROSS/BLUE SHIELD | Attending: Family Medicine | Admitting: Family Medicine

## 2018-12-27 ENCOUNTER — Ambulatory Visit (INDEPENDENT_AMBULATORY_CARE_PROVIDER_SITE_OTHER): Payer: BLUE CROSS/BLUE SHIELD

## 2018-12-27 ENCOUNTER — Other Ambulatory Visit: Payer: Self-pay

## 2018-12-27 DIAGNOSIS — M25562 Pain in left knee: Secondary | ICD-10-CM

## 2018-12-27 MED ORDER — MELOXICAM 15 MG PO TABS: 15.0000 mg | ORAL_TABLET | Freq: Every day | ORAL | 0 refills | Status: DC

## 2018-12-27 NOTE — ED Triage Notes (Signed)
Left lateral knee pain began 2 weeks ago, pain is intermittent

## 2018-12-27 NOTE — Discharge Instructions (Addendum)
I believe that your knee pain is related to arthritis Rest, ice, elevate the leg.  Wear a knee sleeve, you can get these at drug stores Meloxicam for pain and inflammation If symptoms continue you will need to follow up with orthopedic for further evaluation
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
## 2020-03-13 DIAGNOSIS — Z713 Dietary counseling and surveillance: Secondary | ICD-10-CM | POA: Diagnosis not present

## 2020-05-25 ENCOUNTER — Other Ambulatory Visit: Payer: Self-pay

## 2020-05-25 ENCOUNTER — Ambulatory Visit
Admission: EM | Admit: 2020-05-25 | Discharge: 2020-05-25 | Disposition: A | Discharge status: Home / Self Care | Payer: BLUE CROSS/BLUE SHIELD | Attending: Emergency Medicine | Admitting: Emergency Medicine

## 2020-05-25 DIAGNOSIS — Z1152 Encounter for screening for COVID-19: Secondary | ICD-10-CM | POA: Diagnosis not present

## 2020-05-25 DIAGNOSIS — J209 Acute bronchitis, unspecified: Secondary | ICD-10-CM

## 2020-05-25 MED ORDER — BENZONATATE 100 MG PO CAPS: 100.0000 mg | ORAL_CAPSULE | Freq: Three times a day (TID) | ORAL | 0 refills | Status: DC

## 2020-05-25 MED ORDER — CETIRIZINE HCL 10 MG PO TABS: 10.0000 mg | ORAL_TABLET | Freq: Every day | ORAL | 0 refills | Status: DC

## 2020-05-25 MED ORDER — FLUTICASONE PROPIONATE 50 MCG/ACT NA SUSP: 1.0000 | Freq: Every day | NASAL | 0 refills | Status: DC
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
## 2020-05-25 NOTE — Discharge Instructions (Addendum)
Your COVID test is pending - it is important to quarantine / isolate at home until your results are back. °If you test positive and would like further evaluation for persistent or worsening symptoms, you may schedule an E-visit or virtual (video) visit throughout the  MyChart app or website. ° °PLEASE NOTE: If you develop severe chest pain or shortness of breath please go to the ER or call 9-1-1 for further evaluation --> DO NOT schedule electronic or virtual visits for this. °Please call our office for further guidance / recommendations as needed. ° °For information about the Covid vaccine, please visit [email or web address] °

## 2020-05-25 NOTE — ED Triage Notes (Signed)
Pt states he developed a cough last week that is currently improving with nyquil. Pt also complains of chills and is lethargic. Pt is aox4 and ambulatory.

## 2020-05-28 LAB — NOVEL CORONAVIRUS, NAA: SARS-CoV-2, NAA: DETECTED — AB

## 2020-07-10 DIAGNOSIS — Z713 Dietary counseling and surveillance: Secondary | ICD-10-CM | POA: Diagnosis not present

## 2020-08-20 ENCOUNTER — Encounter: Payer: Self-pay | Admitting: Emergency Medicine

## 2020-08-20 ENCOUNTER — Ambulatory Visit
Admission: EM | Admit: 2020-08-20 | Discharge: 2020-08-20 | Disposition: A | Discharge status: Home / Self Care | Payer: BC Managed Care – PPO | Attending: Emergency Medicine | Admitting: Emergency Medicine

## 2020-08-20 ENCOUNTER — Ambulatory Visit (INDEPENDENT_AMBULATORY_CARE_PROVIDER_SITE_OTHER): Payer: BC Managed Care – PPO

## 2020-08-20 DIAGNOSIS — R079 Chest pain, unspecified: Secondary | ICD-10-CM

## 2020-08-20 DIAGNOSIS — R0789 Other chest pain: Secondary | ICD-10-CM | POA: Diagnosis not present

## 2020-08-20 MED ORDER — OMEPRAZOLE 20 MG PO CPDR: 20.0000 mg | DELAYED_RELEASE_CAPSULE | Freq: Two times a day (BID) | ORAL | 0 refills | Status: DC

## 2020-08-20 MED ORDER — FAMOTIDINE 20 MG PO TABS
20.0000 mg | ORAL_TABLET | Freq: Two times a day (BID) | ORAL | 0 refills | Status: DC
Start: 2020-08-20 — End: 2021-01-21

## 2020-08-20 NOTE — Discharge Instructions (Signed)
X-ray normal, EKG normal Please begin omeprazole/Prilosec twice daily for the next 2 weeks Supplement with Pepcid or Maalox twice daily for more immediate relief of underlying reflux Please follow-up if not improving or worsening

## 2020-08-20 NOTE — ED Triage Notes (Signed)
Pt said he has been having chest pain that comes and does x 3 weeks on the right side. Pt said its like a burning sensation. No n/v does not radiate
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
## 2020-10-22 DIAGNOSIS — Z0000 Encounter for general adult medical examination without abnormal findings: Secondary | ICD-10-CM | POA: Diagnosis not present

## 2020-10-22 DIAGNOSIS — Z209 Contact with and (suspected) exposure to unspecified communicable disease: Secondary | ICD-10-CM | POA: Diagnosis not present

## 2020-10-22 DIAGNOSIS — Z125 Encounter for screening for malignant neoplasm of prostate: Secondary | ICD-10-CM | POA: Diagnosis not present

## 2020-10-22 DIAGNOSIS — Z23 Encounter for immunization: Secondary | ICD-10-CM | POA: Diagnosis not present

## 2020-10-22 DIAGNOSIS — Z13220 Encounter for screening for lipoid disorders: Secondary | ICD-10-CM | POA: Diagnosis not present

## 2020-11-20 ENCOUNTER — Encounter: Payer: Self-pay | Admitting: Internal Medicine

## 2021-01-21 ENCOUNTER — Other Ambulatory Visit: Payer: Self-pay

## 2021-01-21 ENCOUNTER — Ambulatory Visit (AMBULATORY_SURGERY_CENTER): Payer: Self-pay

## 2021-01-21 VITALS — Ht 71.0 in | Wt 219.0 lb

## 2021-01-21 DIAGNOSIS — Z86010 Personal history of colonic polyps: Secondary | ICD-10-CM

## 2021-01-21 DIAGNOSIS — Z1211 Encounter for screening for malignant neoplasm of colon: Secondary | ICD-10-CM

## 2021-01-21 MED ORDER — SUTAB 1479-225-188 MG PO TABS: 1.0000 | ORAL_TABLET | ORAL | 0 refills | Status: DC

## 2021-01-21 NOTE — Progress Notes (Signed)
No egg or soy allergy known to patient  No issues with past sedation with any surgeries or procedures Patient denies ever being told they had issues or difficulty with intubation  No FH of Malignant Hyperthermia No diet pills per patient No home 02 use per patient  No blood thinners per patient  Pt reports issues with constipation -knows to increase fluids and tries to take OTC medications to relieve constipation - patient is a truck driver - patient was advised to take Miralax beginning on Friday evening when he returns from being out of town on job -1/2 capful every 8hr per pt request to assist with constipation prior to prep No A fib or A flutter   COVID 19 guidelines implemented in PV today with Pt and RN  Coupon given to pt in PV today, Code to Pharmacy and NO PA's for preps discussed with pt in PV today  Discussed with pt there will be an out-of-pocket cost for prep and that varies from $0 to 70 dollars   Due to the COVID-19 pandemic we are asking patients to follow certain guidelines.  Pt aware of COVID protocols and [facility] guidelines

## 2021-02-04 ENCOUNTER — Other Ambulatory Visit: Payer: Self-pay

## 2021-02-04 ENCOUNTER — Encounter: Payer: Self-pay | Admitting: Internal Medicine

## 2021-02-04 ENCOUNTER — Ambulatory Visit (AMBULATORY_SURGERY_CENTER): Payer: No Typology Code available for payment source | Admitting: Internal Medicine

## 2021-02-04 VITALS — BP 109/72 | HR 61 | Temp 98.4°F | Resp 16 | Ht 71.0 in | Wt 219.0 lb

## 2021-02-04 DIAGNOSIS — D124 Benign neoplasm of descending colon: Secondary | ICD-10-CM | POA: Diagnosis not present

## 2021-02-04 DIAGNOSIS — D120 Benign neoplasm of cecum: Secondary | ICD-10-CM | POA: Diagnosis not present

## 2021-02-04 DIAGNOSIS — Z86010 Personal history of colonic polyps: Secondary | ICD-10-CM | POA: Diagnosis not present

## 2021-02-04 MED ORDER — SODIUM CHLORIDE 0.9 % IV SOLN: 500.0000 mL | INTRAVENOUS | Status: DC

## 2021-02-04 NOTE — Progress Notes (Signed)
Called to room to assist during endoscopic procedure.  Patient ID and intended procedure confirmed with present staff. Received instructions for my participation in the procedure from the performing physician.  

## 2021-02-04 NOTE — Progress Notes (Signed)
pt tolerated well. VSS. awake and to recovery. Report given to RN.  

## 2021-02-04 NOTE — Op Note (Signed)
[facility] Patient Name: Craig Patterson Procedure Date: 02/04/2021 12:38 PM MRN: [id number] Endoscopist: Jerene Bears , MD Age: 55 Referring MD:  Date of Birth: 1965/12/22 Gender: Male Account #: [id number] Procedure:                Colonoscopy Indications:              High risk colon cancer surveillance: Personal                            history of non-advanced adenoma, Last colonoscopy:                            2014 Medicines:                Monitored Anesthesia Care Procedure:                Pre-Anesthesia Assessment:                           - Prior to the procedure, a History and Physical                            was performed, and patient medications and                            allergies were reviewed. The patient's tolerance of                            previous anesthesia was also reviewed. The risks                            and benefits of the procedure and the sedation                            options and risks were discussed with the patient.                            All questions were answered, and informed consent                            was obtained. Prior Anticoagulants: The patient has                            taken no previous anticoagulant or antiplatelet                            agents. ASA Grade Assessment: II - A patient with                            mild systemic disease. After reviewing the risks                            and benefits, the patient was deemed in  satisfactory condition to undergo the procedure.                           After obtaining informed consent, the colonoscope                            was passed under direct vision. Throughout the                            procedure, the patient's blood pressure, pulse, and                            oxygen saturations were monitored continuously. The                            Olympus CF-HQ190L [id number]) Colonoscope was                             introduced through the anus and advanced to the                            cecum, identified by appendiceal orifice and                            ileocecal valve. The colonoscopy was performed                            without difficulty. The patient tolerated the                            procedure well. The quality of the bowel                            preparation was good. The ileocecal valve,                            appendiceal orifice, and rectum were photographed. Scope In: 1:42:56 PM Scope Out: 2:01:13 PM Scope Withdrawal Time: 0 hours 13 minutes 48 seconds  Total Procedure Duration: 0 hours 18 minutes 17 seconds  Findings:                 A 1 mm polyp was found in the cecum. The polyp was                            sessile. The polyp was removed with a cold biopsy                            forceps. Resection and retrieval were complete.                           A 3 mm polyp was found in the descending colon. The                            polyp was sessile. The  polyp was removed with a                            cold biopsy forceps. Resection and retrieval were                            complete.                           Multiple small-mouthed diverticula were found in                            the sigmoid colon and hepatic flexure.                           Internal hemorrhoids were found during                            retroflexion. The hemorrhoids were small. Complications:            No immediate complications. Estimated Blood Loss:     Estimated blood loss was minimal. Impression:               - One 1 mm polyp in the cecum, removed with a cold                            biopsy forceps. Resected and retrieved.                           - One 3 mm polyp in the descending colon, removed                            with a cold biopsy forceps. Resected and retrieved.                           - Diverticulosis in the sigmoid colon and at the                             hepatic flexure.                           - Small internal hemorrhoids. Recommendation:           - Patient has a contact number available for                            emergencies. The signs and symptoms of potential                            delayed complications were discussed with the                            patient. Return to normal activities tomorrow.                            Written discharge instructions were provided to the  patient.                           - Resume previous diet.                           - Continue present medications.                           - Await pathology results.                           - Repeat colonoscopy is recommended for                            surveillance. The colonoscopy date will be                            determined after pathology results from today's                            exam become available for review. Jerene Bears, MD 02/04/2021 2:04:31 PM This report has been signed electronically.

## 2021-02-04 NOTE — Patient Instructions (Signed)
Handouts provided on polyps, diverticulosis and hemorrhoids.   YOU HAD AN ENDOSCOPIC PROCEDURE TODAY AT THE [facility]:   Refer to the procedure report that was given to you for any specific questions about what was found during the examination.  If the procedure report does not answer your questions, please call your gastroenterologist to clarify.  If you requested that your care partner not be given the details of your procedure findings, then the procedure report has been included in a sealed envelope for you to review at your convenience later.  YOU SHOULD EXPECT: Some feelings of bloating in the abdomen. Passage of more gas than usual.  Walking can help get rid of the air that was put into your GI tract during the procedure and reduce the bloating. If you had a lower endoscopy (such as a colonoscopy or flexible sigmoidoscopy) you may notice spotting of blood in your stool or on the toilet paper. If you underwent a bowel prep for your procedure, you may not have a normal bowel movement for a few days.  Please Note:  You might notice some irritation and congestion in your nose or some drainage.  This is from the oxygen used during your procedure.  There is no need for concern and it should clear up in a day or so.  SYMPTOMS TO REPORT IMMEDIATELY:   Following lower endoscopy (colonoscopy or flexible sigmoidoscopy):  Excessive amounts of blood in the stool  Significant tenderness or worsening of abdominal pains  Swelling of the abdomen that is new, acute  Fever of 100F or higher   For urgent or emergent issues, a gastroenterologist can be reached at any hour by calling [phone]. Do not use MyChart messaging for urgent concerns.    DIET:  We do recommend a small meal at first, but then you may proceed to your regular diet.  Drink plenty of fluids but you should avoid alcoholic beverages for 24 hours.  ACTIVITY:  You should plan to take it easy for the rest of today and  you should NOT DRIVE or use heavy machinery until tomorrow (because of the sedation medicines used during the test).    FOLLOW UP: Our staff will call the number listed on your records 48-72 hours following your procedure to check on you and address any questions or concerns that you may have regarding the information given to you following your procedure. If we do not reach you, we will leave a message.  We will attempt to reach you two times.  During this call, we will ask if you have developed any symptoms of COVID 19. If you develop any symptoms (ie: fever, flu-like symptoms, shortness of breath, cough etc.) before then, please call [phone].  If you test positive for Covid 19 in the 2 weeks post procedure, please call and report this information to us.    If any biopsies were taken you will be contacted by phone or by letter within the next 1-3 weeks.  Please call us at [phone] if you have not heard about the biopsies in 3 weeks.    SIGNATURES/CONFIDENTIALITY: You and/or your care partner have signed paperwork which will be entered into your electronic medical record.  These signatures attest to the fact that that the information above on your After Visit Summary has been reviewed and is understood.  Full responsibility of the confidentiality of this discharge information lies with you and/or your care-partner.  

## 2021-02-04 NOTE — Progress Notes (Signed)
Pt's states no medical or surgical changes since previsit or office visit. 

## 2021-02-06 ENCOUNTER — Telehealth: Payer: Self-pay | Admitting: *Deleted

## 2021-02-06 NOTE — Telephone Encounter (Signed)
Message left
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
## 2021-02-13 ENCOUNTER — Encounter: Payer: Self-pay | Admitting: Internal Medicine

## 2021-07-31 IMAGING — DX DG CHEST 2V
2 series · 2 of 2 positions shown · non-contrast
Comparison: None.

CLINICAL DATA: Intermitted right-sided chest pain.

EXAM:
CHEST - 2 VIEW

[chest pa]
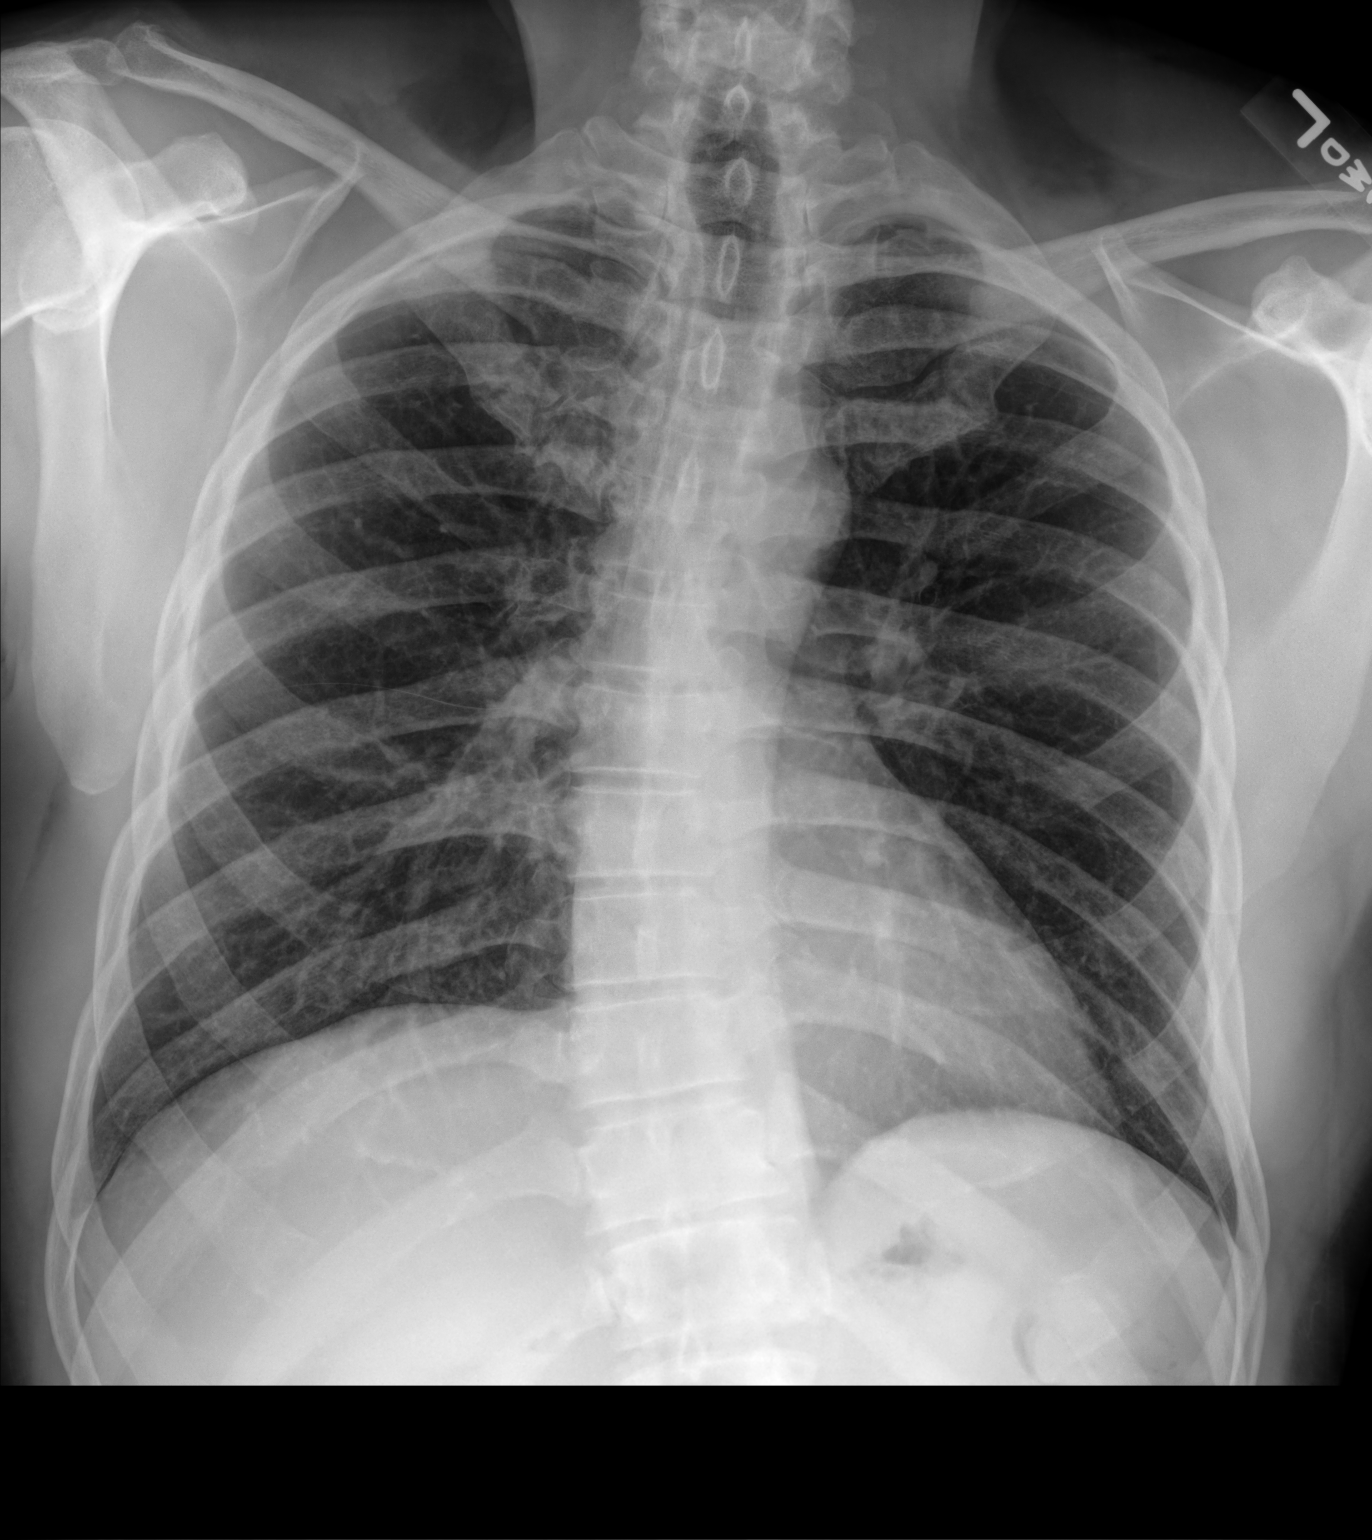

[chest lat]
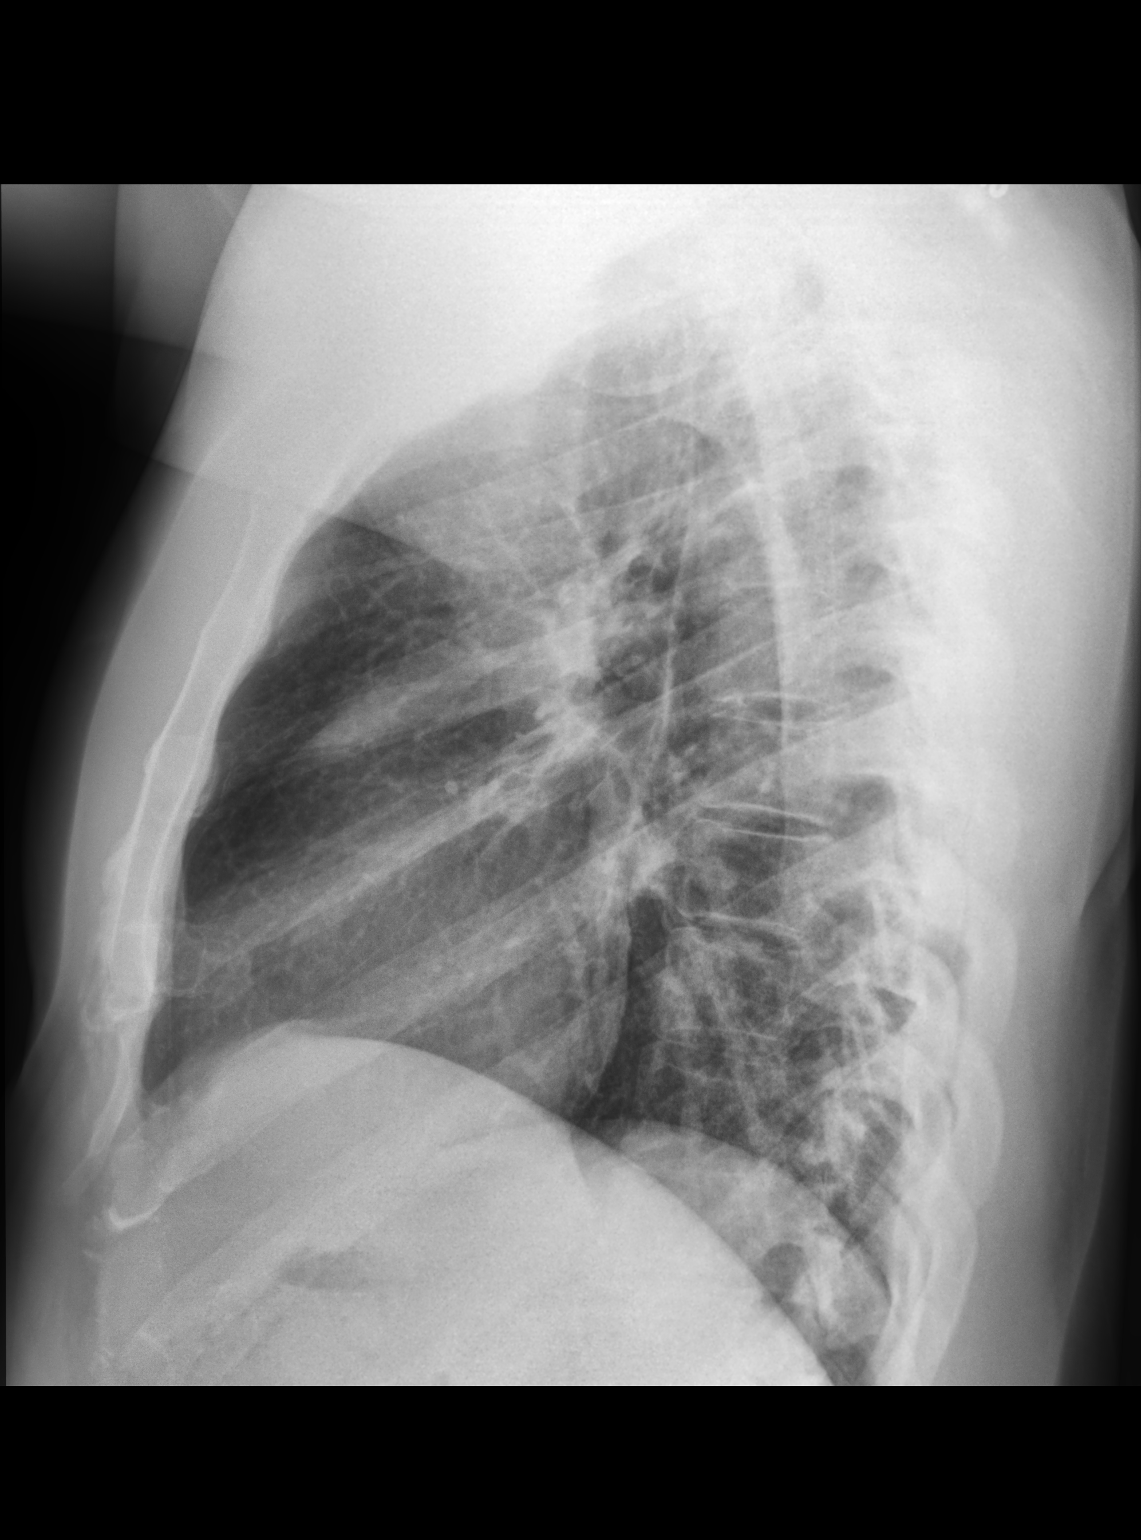

[2 of 2 positions shown; findings below may reference images not displayed]

FINDINGS: The heart size and mediastinal contours are within normal limits.
Both lungs are clear. No visible pleural effusions or pneumothorax.
No acute osseous abnormality.
IMPRESSION: No active cardiopulmonary disease.

## 2024-06-08 ENCOUNTER — Other Ambulatory Visit (HOSPITAL_BASED_OUTPATIENT_CLINIC_OR_DEPARTMENT_OTHER): Payer: Self-pay | Admitting: Family Medicine

## 2024-06-08 DIAGNOSIS — R0789 Other chest pain: Secondary | ICD-10-CM

## 2024-07-04 ENCOUNTER — Ambulatory Visit (HOSPITAL_BASED_OUTPATIENT_CLINIC_OR_DEPARTMENT_OTHER)
Admission: RE | Admit: 2024-07-04 | Discharge: 2024-07-04 | Disposition: A | Discharge status: Home / Self Care | Payer: Self-pay | Source: Ambulatory Visit | Attending: Family Medicine | Admitting: Family Medicine

## 2024-07-04 DIAGNOSIS — R0789 Other chest pain: Secondary | ICD-10-CM | POA: Insufficient documentation
